# Patient Record
Sex: Male | Born: 2000 | Race: White | Hispanic: No | Marital: Single | Smoking: Never smoker
Health system: Southern US, Community
[De-identification: ages and names within clinical notes are randomized; demographics above are authoritative.]

## PROBLEM LIST (undated history)

## (undated) DIAGNOSIS — Z789 Other specified health status: Secondary | ICD-10-CM

## (undated) HISTORY — DX: Other specified health status: Z78.9

---
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
## 2018-07-26 IMAGING — DX DG LUMBAR SPINE COMPLETE 4+V
5 series · 5 of 5 positions shown · non-contrast
Comparison: None.

CLINICAL DATA: Intermittent low back pain for the past 2 years. No
known injury.

EXAM:
LUMBAR SPINE - COMPLETE 4+ VIEW

[l-spine ap]
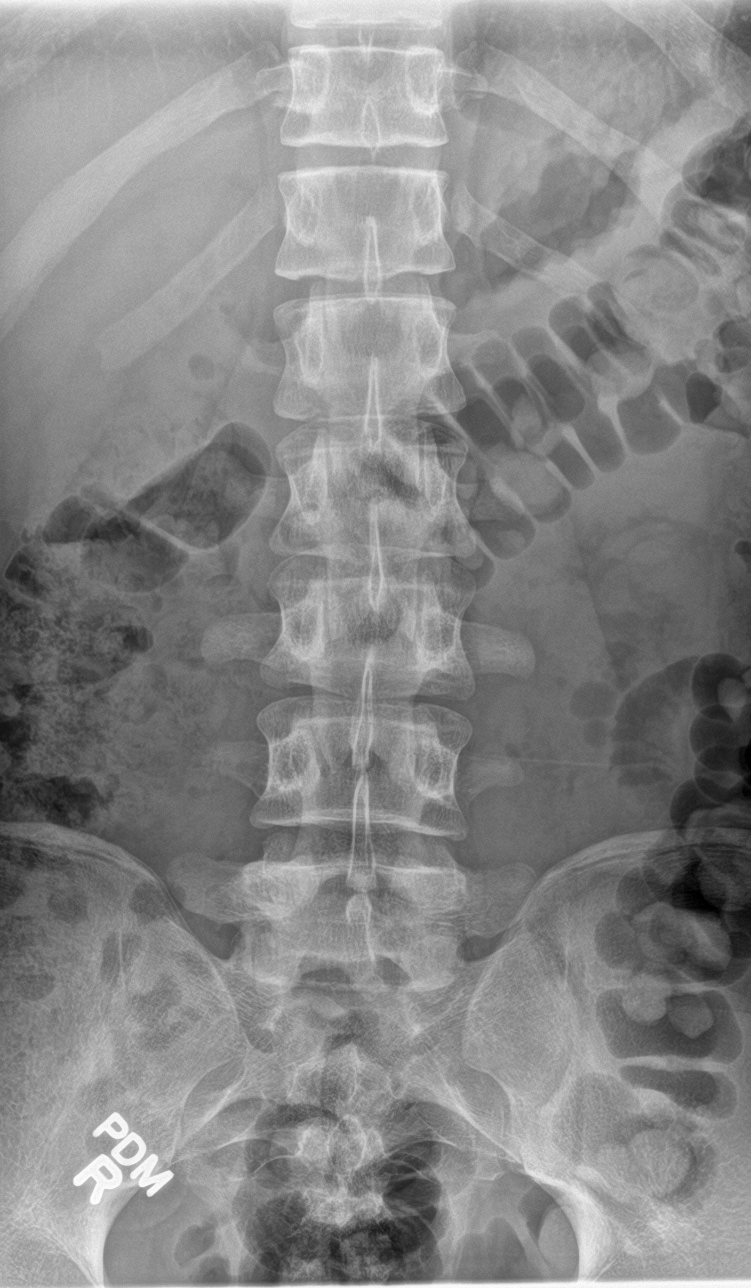

[l-spine obl (1 of 2)]
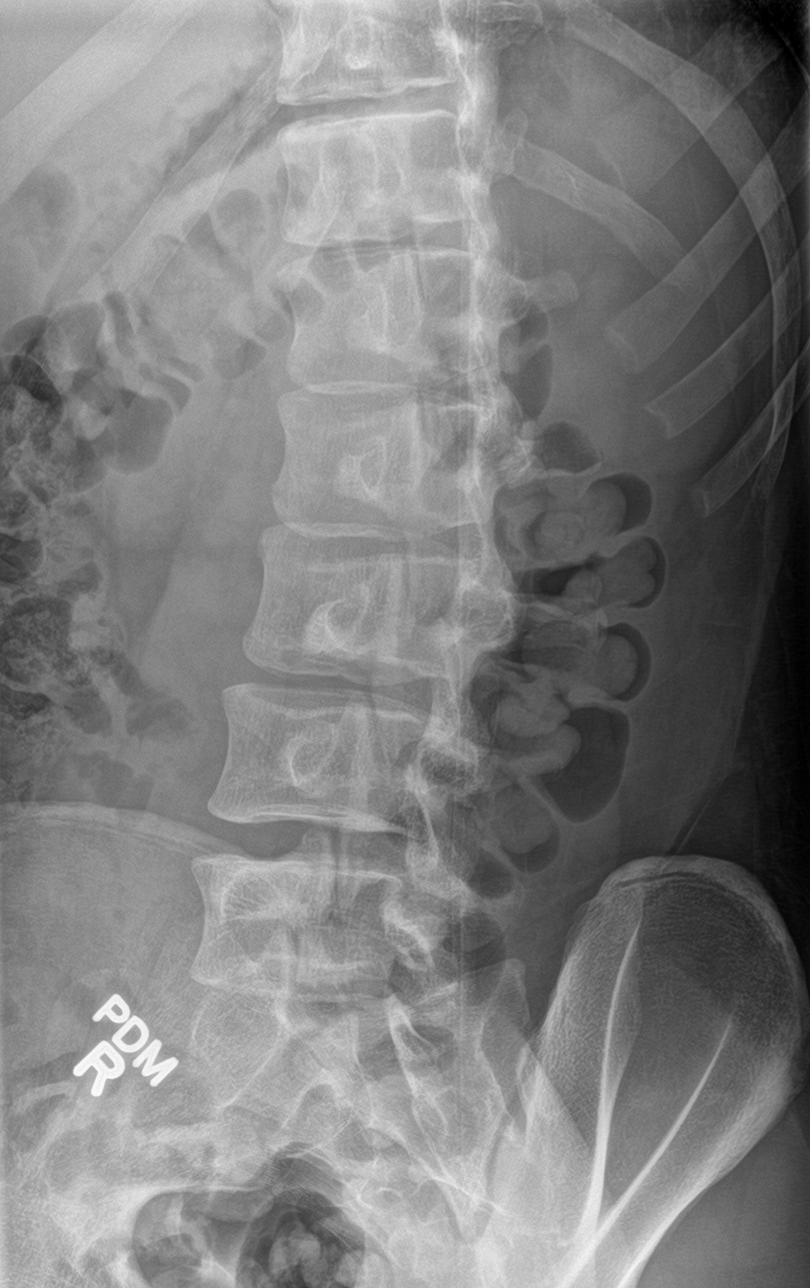

[l-spine obl (2 of 2)]
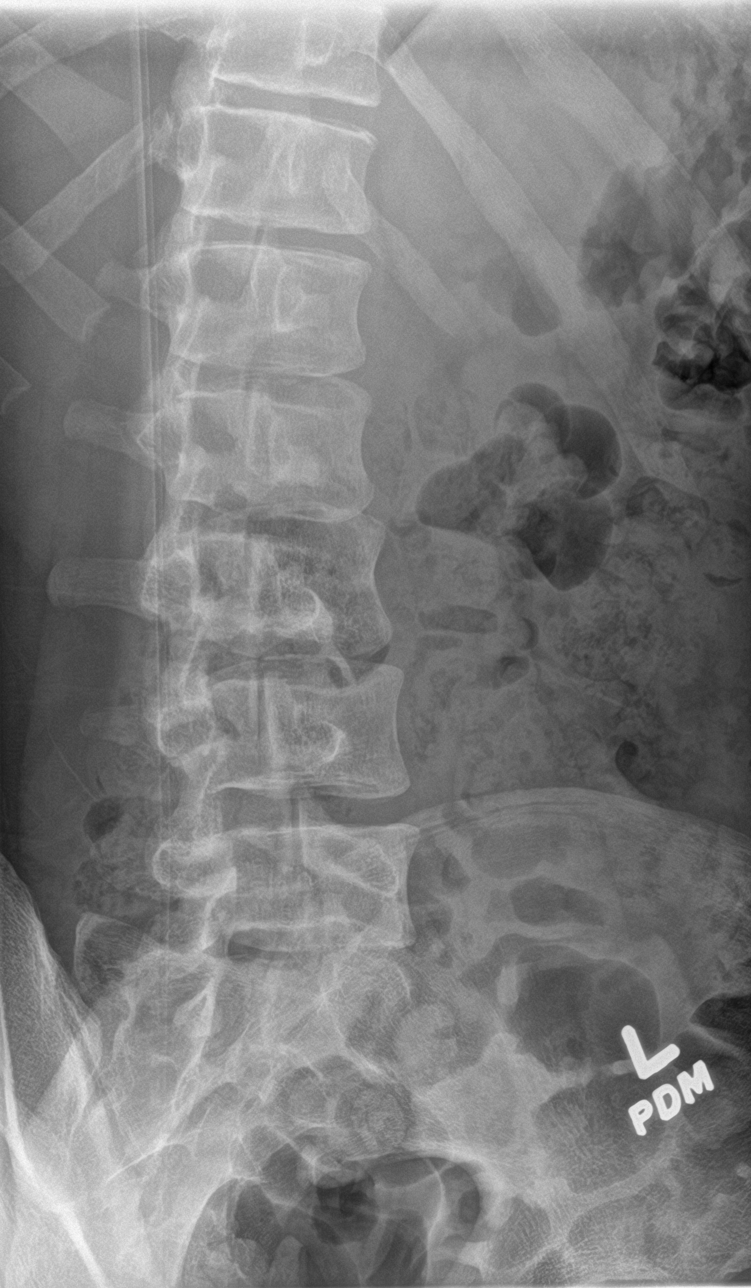

[l-spine lat]
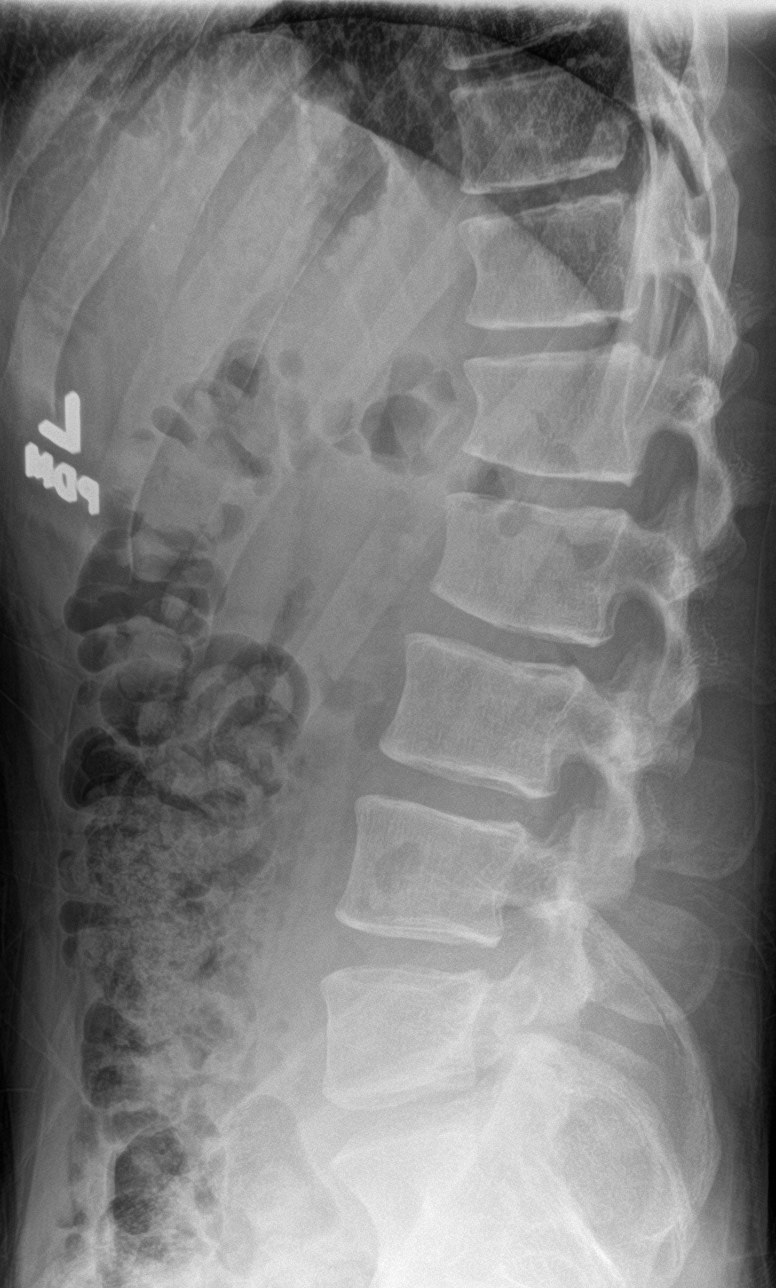

[l-spine spot]
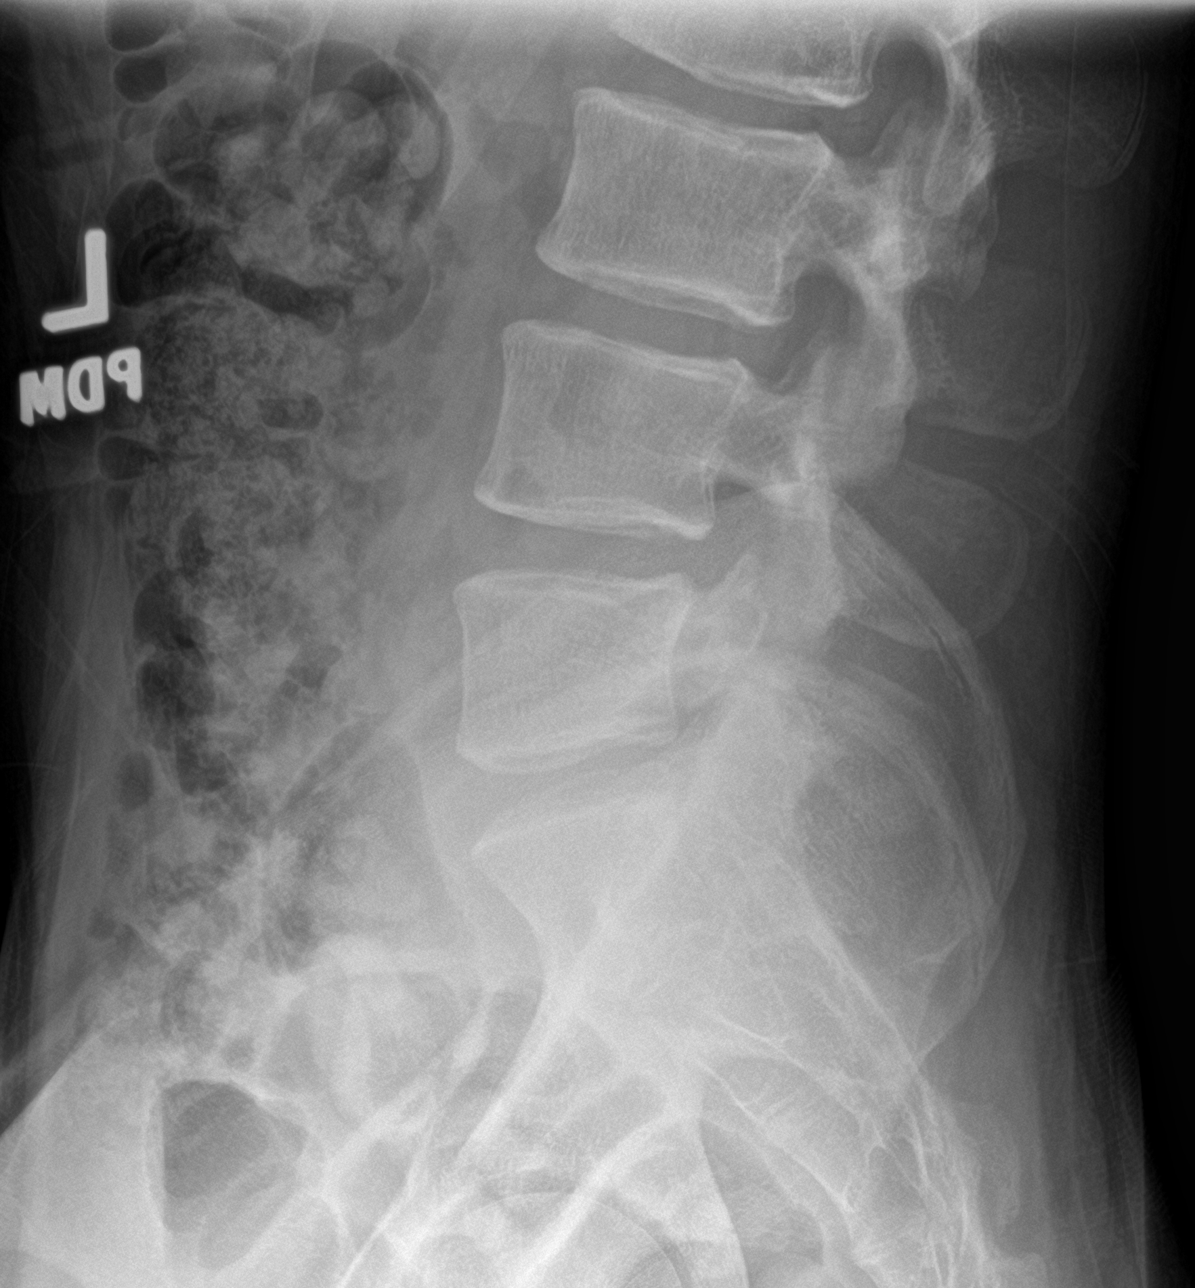

[5 of 5 positions shown; findings below may reference images not displayed]

FINDINGS: There are 5 non rib-bearing lumbar type vertebral bodies.

Mild (approximately 25%) age-indeterminate compression deformities
involving the L1 and L2 vertebral bodies with associated focal
kyphosis at this location. No anterolisthesis or retrolisthesis. No
definite pars defects.

Intervertebral disc space heights appear preserved.

Limited visualization of the bilateral SI joints is normal.

Regional bowel gas pattern and soft tissues are normal.
IMPRESSION: Age-indeterminate mild (approximately 25%) compression deformities
involving the L1 and L2 vertebral bodies with associated focal
kyphosis. Correlation for point tenderness at these locations is
recommended.

## 2019-06-08 ENCOUNTER — Other Ambulatory Visit: Payer: Self-pay

## 2019-06-08 DIAGNOSIS — Z20822 Contact with and (suspected) exposure to covid-19: Secondary | ICD-10-CM

## 2019-06-10 LAB — NOVEL CORONAVIRUS, NAA: SARS-CoV-2, NAA: DETECTED — AB
